# Patient Record
Sex: Female | Born: 1975 | Race: White | Hispanic: No | Marital: Single | Smoking: Current some day smoker
Health system: Southern US, Community
[De-identification: ages and names within clinical notes are randomized; demographics above are authoritative.]

## PROBLEM LIST (undated history)

## (undated) DIAGNOSIS — E039 Hypothyroidism, unspecified: Secondary | ICD-10-CM

---

## 2004-01-31 ENCOUNTER — Ambulatory Visit: Payer: Self-pay | Admitting: Internal Medicine

## 2005-01-11 ENCOUNTER — Ambulatory Visit: Payer: Self-pay

## 2005-03-28 ENCOUNTER — Observation Stay: Payer: Self-pay

## 2005-03-29 ENCOUNTER — Observation Stay: Payer: Self-pay | Admitting: Obstetrics and Gynecology

## 2005-04-14 ENCOUNTER — Inpatient Hospital Stay: Payer: Self-pay

## 2007-11-06 ENCOUNTER — Ambulatory Visit: Payer: Self-pay | Admitting: Obstetrics and Gynecology

## 2007-11-09 ENCOUNTER — Ambulatory Visit: Payer: Self-pay | Admitting: Obstetrics and Gynecology

## 2008-04-08 ENCOUNTER — Ambulatory Visit: Payer: Self-pay | Admitting: Obstetrics and Gynecology

## 2008-04-14 ENCOUNTER — Ambulatory Visit: Payer: Self-pay | Admitting: Obstetrics and Gynecology

## 2017-08-26 ENCOUNTER — Inpatient Hospital Stay (HOSPITAL_COMMUNITY)
Admission: AD | Admit: 2017-08-26 | Discharge: 2017-08-28 | DRG: 885 | Disposition: A | Discharge status: Home / Self Care | Payer: No Typology Code available for payment source | Source: Intra-hospital | Attending: Psychiatry | Admitting: Psychiatry

## 2017-08-26 ENCOUNTER — Encounter (HOSPITAL_COMMUNITY): Payer: Self-pay | Admitting: *Deleted

## 2017-08-26 ENCOUNTER — Other Ambulatory Visit: Payer: Self-pay

## 2017-08-26 DIAGNOSIS — R4587 Impulsiveness: Secondary | ICD-10-CM | POA: Diagnosis not present

## 2017-08-26 DIAGNOSIS — F17210 Nicotine dependence, cigarettes, uncomplicated: Secondary | ICD-10-CM | POA: Diagnosis present

## 2017-08-26 DIAGNOSIS — F322 Major depressive disorder, single episode, severe without psychotic features: Principal | ICD-10-CM | POA: Diagnosis present

## 2017-08-26 DIAGNOSIS — T1491XA Suicide attempt, initial encounter: Secondary | ICD-10-CM | POA: Diagnosis not present

## 2017-08-26 DIAGNOSIS — T43222A Poisoning by selective serotonin reuptake inhibitors, intentional self-harm, initial encounter: Secondary | ICD-10-CM | POA: Diagnosis not present

## 2017-08-26 DIAGNOSIS — E039 Hypothyroidism, unspecified: Secondary | ICD-10-CM | POA: Diagnosis present

## 2017-08-26 DIAGNOSIS — Z91411 Personal history of adult psychological abuse: Secondary | ICD-10-CM | POA: Diagnosis not present

## 2017-08-26 DIAGNOSIS — R45851 Suicidal ideations: Secondary | ICD-10-CM | POA: Diagnosis present

## 2017-08-26 DIAGNOSIS — Z79890 Hormone replacement therapy (postmenopausal): Secondary | ICD-10-CM

## 2017-08-26 DIAGNOSIS — F419 Anxiety disorder, unspecified: Secondary | ICD-10-CM | POA: Diagnosis present

## 2017-08-26 HISTORY — DX: Hypothyroidism, unspecified: E03.9

## 2017-08-26 MED ORDER — HYDROXYZINE HCL 25 MG PO TABS
25.0000 mg | ORAL_TABLET | Freq: Three times a day (TID) | ORAL | Status: DC | PRN
  Filled 2017-08-26: qty 10
  Filled 2017-08-26: qty 1

## 2017-08-26 MED ORDER — MAGNESIUM HYDROXIDE 400 MG/5ML PO SUSP: 30.0000 mL | Freq: Every day | ORAL | Status: DC | PRN

## 2017-08-26 MED ORDER — IBUPROFEN 600 MG PO TABS
600.0000 mg | ORAL_TABLET | Freq: Four times a day (QID) | ORAL | Status: DC | PRN
  Administered 2017-08-27 – 2017-08-28 (×5): 600 mg via ORAL
  Filled 2017-08-26 (×5): qty 1

## 2017-08-26 MED ORDER — ALUM & MAG HYDROXIDE-SIMETH 200-200-20 MG/5ML PO SUSP: 30.0000 mL | ORAL | Status: DC | PRN

## 2017-08-26 MED ORDER — ACETAMINOPHEN 325 MG PO TABS
650.0000 mg | ORAL_TABLET | Freq: Four times a day (QID) | ORAL | Status: DC | PRN
  Administered 2017-08-26: 650 mg via ORAL
  Filled 2017-08-26: qty 2

## 2017-08-26 MED ORDER — TRAZODONE HCL 50 MG PO TABS
50.0000 mg | ORAL_TABLET | Freq: Every evening | ORAL | Status: DC | PRN
  Filled 2017-08-26: qty 1

## 2017-08-26 NOTE — Progress Notes (Signed)
D.  Pt pleasant on approach, denies complaints other than continued chronic back pain (see pain flowsheet).  Pt was positive for evening wrap up group, has been observed appropriately interacting with peers on the unit.  Pt denies SI/HI/AVH at this time.  A.  Support and encouragement offered, medication given as ordered.  R.  Pt remains safe on the unit, will continue to monitor.

## 2017-08-26 NOTE — Tx Team (Signed)
Initial Treatment Plan 08/26/2017 6:18 PM Alexis Mueller XBJ:[id number]    PATIENT STRESSORS: Marital or family conflict   PATIENT STRENGTHS: Ability for insight Average or above average intelligence Capable of independent living Wellsite geologist fund of knowledge Motivation for treatment/growth   PATIENT IDENTIFIED PROBLEMS: Depression Suicidal thoughts "Had a disagreement with fiancee and feeling overwhelmed"                     DISCHARGE CRITERIA:  Ability to meet basic life and health needs Improved stabilization in mood, thinking, and/or behavior Reduction of life-threatening or endangering symptoms to within safe limits Verbal commitment to aftercare and medication compliance  PRELIMINARY DISCHARGE PLAN: Attend aftercare/continuing care group Return to previous living arrangement  PATIENT/FAMILY INVOLVEMENT: This treatment plan has been presented to and reviewed with the patient, Alexis Mueller, and/or family member, .  The patient and family have been given the opportunity to ask questions and make suggestions.  Alexis Mueller, [name], [place] 08/26/2017, 6:18 PM
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
## 2017-08-26 NOTE — [facility] Group Notes (Signed)
[facility] Group Notes:  (Nursing)  Date:  08/26/2017  Time 2:30 PM Type of Therapy:  Nurse Education  Participation Level:  Did Not Attend  Participation Quality:  did not attend  Affect:  did not attend  Cognitive:  did not attend  Insight:  None  Engagement in Group:  None  Modes of Intervention:  Discussion, Education and Rapport Building  Summary of Progress/Problems:  Shela Nevin 08/26/2017, 7:15 PM

## 2017-08-26 NOTE — Progress Notes (Signed)
Alexis Mueller is a 42 year old female pt admitted on voluntary basis. She reports that she got into an argument with her fiancee and spoke about how she took an overdose of some old zoloft that she had laying around from her PCP. She reports she did it because she wanted to see if he really cared. She reports that she used to have a PCP but no longer does and reports that she has not had any medications in the past month or so. She reports that she lives with her fiancee and reports that she will go back there after discharge. She was oriented to the unit and safety maintained.

## 2017-08-27 DIAGNOSIS — F322 Major depressive disorder, single episode, severe without psychotic features: Principal | ICD-10-CM

## 2017-08-27 DIAGNOSIS — F419 Anxiety disorder, unspecified: Secondary | ICD-10-CM

## 2017-08-27 DIAGNOSIS — T1491XA Suicide attempt, initial encounter: Secondary | ICD-10-CM

## 2017-08-27 DIAGNOSIS — F17210 Nicotine dependence, cigarettes, uncomplicated: Secondary | ICD-10-CM

## 2017-08-27 DIAGNOSIS — Z91411 Personal history of adult psychological abuse: Secondary | ICD-10-CM

## 2017-08-27 DIAGNOSIS — R4587 Impulsiveness: Secondary | ICD-10-CM

## 2017-08-27 DIAGNOSIS — T43222A Poisoning by selective serotonin reuptake inhibitors, intentional self-harm, initial encounter: Secondary | ICD-10-CM
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
## 2017-08-27 NOTE — Progress Notes (Signed)
Adult Psychoeducational Group Note  Date:  08/27/2017 Time:  9:48 PM  Group Topic/Focus:  Wrap-Up Group:   The focus of this group is to help patients review their daily goal of treatment and discuss progress on daily workbooks.  Participation Level:  Active  Participation Quality:  Appropriate  Affect:  Appropriate  Cognitive:  Alert and Oriented  Insight: Good  Engagement in Group:  Engaged  Modes of Intervention:  Discussion  Additional Comments:  Pt attended group this evening.  Leo Grosser 08/27/2017, 9:48 PM

## 2017-08-27 NOTE — Progress Notes (Signed)
Pt presents with an animated affect and anxious mood. Pt appears to be minimizing her symptoms. Pt denies depression, anxiety and SI/HI. Pt expressed that she was trying to get her boyfriend's attention and that she was not trying to commit suicide. Pt stated goal for today, "I would like to learn to more effectively communicate when I am frustrated and or overwhelmed". Pt c/o of ongoing intermittent left shoulder pain, radiating down her left arm. Pt stated that she was picking up some firewood about a week ago and believes that she pinched a nerve. Pt stated that she was assessed by the PA at [facility] but he didn't do anything about it. Writer informed Dr. Jola Babinski of pt's complaint. Pain meds and hot/cold packs given for discomfort. Orders reviewed with pt. Verbal support provided. Pt encouraged to attend groups. 15 minute checks performed for safety. Pt compliant with tx plan.

## 2017-08-27 NOTE — Plan of Care (Signed)
D: Pt denies SI/HI/AVH. Pt is pleasant and cooperative. Pt stated she was doing good, pt only complaint was her L-shoulder which was 5/ 10 and she stated heat packs and ibuprofen was helping.   A: Pt was offered support and encouragement. Pt was given scheduled medications. Pt was encourage to attend groups. Q 15 minute checks were done for safety.  R:Pt attends groups and interacts well with peers and staff. Pt is taking medication. Pt has no complaints.Pt receptive to treatment and safety maintained on unit.   Problem: Education: Goal: Emotional status will improve Outcome: Progressing   Problem: Education: Goal: Mental status will improve Outcome: Progressing   Problem: Activity: Goal: Sleeping patterns will improve Outcome: Progressing   Problem: Coping: Goal: Coping ability will improve Outcome: Progressing

## 2017-08-27 NOTE — [facility] Group Notes (Signed)
Adult Psychoeducational Group Note  Date:  08/27/2017 Time:  8:38 AM  Group Topic/Focus:  Goals Group:   The focus of this group is to help patients establish daily goals to achieve during treatment and discuss how the patient can incorporate goal setting into their daily lives to aide in recovery.  Participation Level:  Active  Participation Quality:  Appropriate  Affect:  Appropriate  Cognitive:  Alert  Insight: Appropriate  Engagement in Group:  Engaged  Modes of Intervention:  Orientation  Additional Comments:  Pt attended and participated in orientation/goals group. Pt goal for today is to figure out a aftercare plan once she is discharged from [facility].   Dellia Nims 08/27/2017, 8:38 AM
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
## 2017-08-27 NOTE — Progress Notes (Signed)
Adult Psychoeducational Group Note  Date:  08/27/2017 Time:  1320 Group Topic/Focus:  Coping With Mental Health Crisis:   The purpose of this group is to help patients identify strategies for coping with mental health crisis.  Group discusses possible causes of crisis and ways to manage them effectively.  Participation Level:  Active  Participation Quality:  Appropriate  Affect:  Appropriate  Cognitive:  Appropriate  Insight: Appropriate  Engagement in Group:  Engaged  Modes of Intervention:  Discussion  Additional Comments:    Saleha Kalp L  
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
## 2017-08-28 MED ORDER — HYDROXYZINE HCL 25 MG PO TABS: 25.0000 mg | ORAL_TABLET | Freq: Three times a day (TID) | ORAL | 0 refills | Status: AC | PRN

## 2017-08-28 MED ORDER — LEVOTHYROXINE SODIUM 75 MCG PO TABS: 75.0000 ug | ORAL_TABLET | Freq: Every day | ORAL | 0 refills | Status: AC

## 2017-08-28 NOTE — Plan of Care (Signed)
  Problem: Education: Goal: Knowledge of [facility] Education information/materials will improve 08/28/2017 1201 by Joice Lofts, RN Outcome: Completed/Met 08/28/2017 1120 by Joice Lofts, RN Outcome: Progressing Goal: Emotional status will improve 08/28/2017 1201 by Joice Lofts, RN Outcome: Completed/Met 08/28/2017 1120 by Joice Lofts, RN Outcome: Progressing Goal: Mental status will improve 08/28/2017 1201 by Joice Lofts, RN Outcome: Completed/Met 08/28/2017 1120 by Joice Lofts, RN Outcome: Progressing Goal: Verbalization of understanding the information provided will improve Outcome: Completed/Met   Problem: Activity: Goal: Interest or engagement in activities will improve 08/28/2017 1201 by Joice Lofts, RN Outcome: Completed/Met 08/28/2017 1120 by Joice Lofts, RN Outcome: Progressing Goal: Sleeping patterns will improve 08/28/2017 1201 by Joice Lofts, RN Outcome: Completed/Met 08/28/2017 1120 by Joice Lofts, RN Outcome: Progressing   Problem: Coping: Goal: Ability to verbalize frustrations and anger appropriately will improve Outcome: Completed/Met Goal: Ability to demonstrate self-control will improve Outcome: Completed/Met   Problem: Health Behavior/Discharge Planning: Goal: Identification of resources available to assist in meeting health care needs will improve Outcome: Completed/Met Goal: Compliance with treatment plan for underlying cause of condition will improve Outcome: Completed/Met   Problem: Physical Regulation: Goal: Ability to maintain clinical measurements within normal limits will improve Outcome: Completed/Met   Problem: Safety: Goal: Periods of time without injury will increase Outcome: Completed/Met   Problem: Education: Goal: Ability to make informed decisions regarding treatment will improve Outcome: Completed/Met   Problem: Coping: Goal: Coping ability will  improve Outcome: Completed/Met   Problem: Health Behavior/Discharge Planning: Goal: Identification of resources available to assist in meeting health care needs will improve Outcome: Completed/Met   Problem: Medication: Goal: Compliance with prescribed medication regimen will improve Outcome: Completed/Met   Problem: Self-Concept: Goal: Ability to disclose and discuss suicidal ideas will improve Outcome: Completed/Met Goal: Will verbalize positive feelings about self Outcome: Completed/Met   Problem: Education: Goal: Utilization of techniques to improve thought processes will improve Outcome: Completed/Met Goal: Knowledge of the prescribed therapeutic regimen will improve Outcome: Completed/Met   Problem: Activity: Goal: Interest or engagement in leisure activities will improve Outcome: Completed/Met Goal: Imbalance in normal sleep/wake cycle will improve Outcome: Completed/Met   Problem: Coping: Goal: Coping ability will improve Outcome: Completed/Met Goal: Will verbalize feelings Outcome: Completed/Met   Problem: Health Behavior/Discharge Planning: Goal: Ability to make decisions will improve Outcome: Completed/Met Goal: Compliance with therapeutic regimen will improve Outcome: Completed/Met   Problem: Role Relationship: Goal: Will demonstrate positive changes in social behaviors and relationships Outcome: Completed/Met   Problem: Safety: Goal: Ability to disclose and discuss suicidal ideas will improve Outcome: Completed/Met Goal: Ability to identify and utilize support systems that promote safety will improve Outcome: Completed/Met   Problem: Self-Concept: Goal: Will verbalize positive feelings about self Outcome: Completed/Met Goal: Level of anxiety will decrease Outcome: Completed/Met   Problem: Education: Goal: Knowledge of General Education information will improve Outcome: Completed/Met   Problem: Health Behavior/Discharge Planning: Goal: Ability  to manage health-related needs will improve Outcome: Completed/Met   Problem: Coping: Goal: Level of anxiety will decrease Outcome: Completed/Met   Problem: Safety: Goal: Ability to remain free from injury will improve Outcome: Completed/Met

## 2017-08-28 NOTE — Progress Notes (Signed)
  [facility] Adult Case Management Discharge Plan :  Will you be returning to the same living situation after discharge:  Yes,  with fiancee At discharge, do you have transportation home?: Yes,  fiancee Do you have the ability to pay for your medications: No. No medications prescribed.  Release of information consent forms completed and in the chart;  Patient's signature needed at discharge.  Patient to Follow up at: Follow-up Information    [facility] Go on 08/30/2017.   Why:  Please attend your intake appt on Wednesday, 08/30/17, at 1:45pm.  Please bring photo ID, social security card, proof of insurance, and proof of household income. Contact information: [address] [place] [address] [phone]           Next level of care provider has access to [facility] Link:no  Safety Planning and Suicide Prevention discussed: Yes,  with fiancee  Have you used any form of tobacco in the last 30 days? (Cigarettes, Smokeless Tobacco, Cigars, and/or Pipes): Yes  Has patient been referred to the Quitline?: Patient refused referral  Patient has been referred for addiction treatment: Yes  Lorri Frederick, LCSW 08/28/2017, 11:26 AM

## 2017-08-28 NOTE — Progress Notes (Signed)
Discharge note:  Patient discharged home per MD order.  She received all personal belongings from unit and locker.  Patient denies any depressive symptoms or thoughts of self harm.  Reviewed AVS/transition record with patient and she indicated understanding.  Patient left ambulatory with her fiance.

## 2017-08-28 NOTE — Tx Team (Signed)
Interdisciplinary Treatment and Diagnostic Plan Update  08/28/2017 Time of Session: 9:50am Alexis Mueller MRN: [id number]  Principal Diagnosis: <principal problem not specified>  Secondary Diagnoses: Active Problems:   MDD (major depressive disorder), severe (HCC)   Current Medications:  Current Facility-Administered Medications  Medication Dose Route Frequency Provider Last Rate Last Dose  . alum & mag hydroxide-simeth (MAALOX/MYLANTA) 200-200-20 MG/5ML suspension 30 mL  30 mL Oral Q4H PRN Money, Lowry Ram, FNP      . hydrOXYzine (ATARAX/VISTARIL) tablet 25 mg  25 mg Oral TID PRN Money, Lowry Ram, FNP      . ibuprofen (ADVIL,MOTRIN) tablet 600 mg  600 mg Oral Q6H PRN Lindon Romp A, NP   600 mg at 08/28/17 0827  . magnesium hydroxide (MILK OF MAGNESIA) suspension 30 mL  30 mL Oral Daily PRN Money, Lowry Ram, FNP      . traZODone (DESYREL) tablet 50 mg  50 mg Oral QHS PRN Money, Lowry Ram, FNP       PTA Medications: Medications Prior to Admission  Medication Sig Dispense Refill Last Dose  . levothyroxine (SYNTHROID, LEVOTHROID) 75 MCG tablet Take 75 mcg by mouth daily before breakfast.   Past Week at Unknown time  . sertraline (ZOLOFT) 25 MG tablet Take 25 mg by mouth daily.   Past Month at Unknown time    Patient Stressors: Marital or family conflict  Patient Strengths: Ability for insight Average or above average intelligence Capable of independent living Communication skills General fund of knowledge Motivation for treatment/growth  Treatment Modalities: Medication Management, Group therapy, Case management,  1 to 1 session with clinician, Psychoeducation, Recreational therapy.   Physician Treatment Plan for Primary Diagnosis: <principal problem not specified> Farfan Term Goal(s): Improvement in symptoms so as ready for discharge Improvement in symptoms so as ready for discharge   Short Term Goals: Ability to identify changes in lifestyle to reduce recurrence of condition  will improve Ability to verbalize feelings will improve Ability to disclose and discuss suicidal ideas Ability to demonstrate self-control will improve Ability to identify and develop effective coping behaviors will improve Ability to maintain clinical measurements within normal limits will improve Compliance with prescribed medications will improve Ability to identify changes in lifestyle to reduce recurrence of condition will improve Ability to verbalize feelings will improve Ability to disclose and discuss suicidal ideas Ability to demonstrate self-control will improve Ability to identify and develop effective coping behaviors will improve Ability to maintain clinical measurements within normal limits will improve Compliance with prescribed medications will improve  Medication Management: Evaluate patient's response, side effects, and tolerance of medication regimen.  Therapeutic Interventions: 1 to 1 sessions, Unit Group sessions and Medication administration.  Evaluation of Outcomes: Not Met  Physician Treatment Plan for Secondary Diagnosis: Active Problems:   MDD (major depressive disorder), severe ([facility])  [place]ig Term Goal(s): Improvement in symptoms so as ready for discharge Improvement in symptoms so as ready for discharge   Short Term Goals: Ability to identify changes in lifestyle to reduce recurrence of condition will improve Ability to verbalize feelings will improve Ability to disclose and discuss suicidal ideas Ability to demonstrate self-control will improve Ability to identify and develop effective coping behaviors will improve Ability to maintain clinical measurements within normal limits will improve Compliance with prescribed medications will improve Ability to identify changes in lifestyle to reduce recurrence of condition will improve Ability to verbalize feelings will improve Ability to disclose and discuss suicidal ideas Ability to demonstrate self-control  will improve Ability to  identify and develop effective coping behaviors will improve Ability to maintain clinical measurements within normal limits will improve Compliance with prescribed medications will improve     Medication Management: Evaluate patient's response, side effects, and tolerance of medication regimen.  Therapeutic Interventions: 1 to 1 sessions, Unit Group sessions and Medication administration.  Evaluation of Outcomes: Not Met   RN Treatment Plan for Primary Diagnosis: <principal problem not specified> Steven Term Goal(s): Knowledge of disease and therapeutic regimen to maintain health will improve  Short Term Goals: Ability to remain free from injury will improve, Ability to verbalize frustration and anger appropriately will improve, Ability to demonstrate self-control, Ability to participate in decision making will improve, Ability to verbalize feelings will improve, Ability to disclose and discuss suicidal ideas, Ability to identify and develop effective coping behaviors will improve and Compliance with prescribed medications will improve  Medication Management: RN will administer medications as ordered by provider, will assess and evaluate patient's response and provide education to patient for prescribed medication. RN will report any adverse and/or side effects to prescribing provider.  Therapeutic Interventions: 1 on 1 counseling sessions, Psychoeducation, Medication administration, Evaluate responses to treatment, Monitor vital signs and CBGs as ordered, Perform/monitor CIWA, COWS, AIMS and Fall Risk screenings as ordered, Perform wound care treatments as ordered.  Evaluation of Outcomes: Not Met   LCSW Treatment Plan for Primary Diagnosis: <principal problem not specified> Ard Term Goal(s): Safe transition to appropriate next level of care at discharge, Engage patient in therapeutic group addressing interpersonal concerns.  Short Term Goals: Engage patient in  aftercare planning with referrals and resources, Increase social support, Increase ability to appropriately verbalize feelings, Increase emotional regulation, Facilitate acceptance of mental health diagnosis and concerns, Facilitate patient progression through stages of change regarding substance use diagnoses and concerns, Identify triggers associated with mental health/substance abuse issues and Increase skills for wellness and recovery  Therapeutic Interventions: Assess for all discharge needs, 1 to 1 time with Social worker, Explore available resources and support systems, Assess for adequacy in community support network, Educate family and significant other(s) on suicide prevention, Complete Psychosocial Assessment, Interpersonal group therapy.  Evaluation of Outcomes: Not Met   Progress in Treatment: Attending groups: Yes. Participating in groups: Yes. Taking medication as prescribed: Yes. Toleration medication: Yes. Family/Significant other contact made: No, will contact:  the patient's fiance' Patient understands diagnosis: Yes. Discussing patient identified problems/goals with staff: Yes. Medical problems stabilized or resolved: Yes. Denies suicidal/homicidal ideation: Yes. Issues/concerns per patient self-inventory: No. Other:   New problem(s) identified: None   New Short Term/Beitz Term Goal(s): medication stabilization, elimination of SI thoughts, development of comprehensive mental wellness plan.   Patient Goals: "I want to seek outpatient options once I discharge"  Discharge Plan or Barriers: Patient is returning home with her fiance' and plans to follow up with [facility] for outpatient medication management and therapy services.   Reason for Continuation of Hospitalization: Anxiety Depression Medication stabilization  Estimated Length of Stay: Friday, Sep 01, 2017  Attendees: Patient: Alexis Mueller  08/28/2017 11:41 AM  Physician: Dr. Myles Lipps, MD 08/28/2017  11:41 AM  Nursing: Chrys Racer, RN 08/28/2017 11:41 AM  RN Care Manager: Rhunette Croft 08/28/2017 11:41 AM  Social Worker: Radonna Ricker, Craig 08/28/2017 11:41 AM  Recreational Therapist: X 08/28/2017 11:41 AM  Other: X 08/28/2017 11:41 AM  Other: X 08/28/2017 11:41 AM  Other: Rhunette Croft 08/28/2017 11:41 AM    Scribe for Treatment Team: Marylee Floras, Villalba 08/28/2017 11:41 AM
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
## 2017-08-28 NOTE — Plan of Care (Signed)
  Problem: Education: Goal: Knowledge of [facility] Education information/materials will improve Outcome: Progressing Goal: Emotional status will improve Outcome: Progressing Goal: Mental status will improve Outcome: Progressing   Problem: Activity: Goal: Interest or engagement in activities will improve Outcome: Progressing Goal: Sleeping patterns will improve Outcome: Progressing   

## 2017-08-28 NOTE — [facility] Suicide Risk Assessment (Signed)
[facility] Discharge Suicide Risk Assessment   Principal Problem: <principal problem not specified> Discharge Diagnoses:  Patient Active Problem List   Diagnosis Date Noted  . MDD (major depressive disorder), severe (HCC) [F32.2] 08/26/2017    Total Time spent with patient: 30 minutes  Musculoskeletal: Strength & Muscle Tone: within normal limits Gait & Station: normal Patient leans: N/A  Psychiatric Specialty Exam: Review of Systems  All other systems reviewed and are negative.   Blood pressure 102/70, pulse 73, temperature 98.1 F (36.7 C), temperature source Oral, resp. rate 16, height  (1.626 m), weight 63 kg (139 lb).Body mass index is 23.86 kg/m.  General Appearance: Casual  Eye Contact::  Good  Speech:  Clear and Coherent409  Volume:  Normal  Mood:  Euthymic  Affect:  Appropriate  Thought Process:  Coherent  Orientation:  Full (Time, Place, and Person)  Thought Content:  Logical  Suicidal Thoughts:  No  Homicidal Thoughts:  No  Memory:  Immediate;   Fair  Judgement:  Fair  Insight:  Fair  Psychomotor Activity:  Normal  Concentration:  Good  Recall:  [employer] of Knowledge:Fair  Language: Fair  Akathisia:  No  Handed:  Right  AIMS (if indicated):     Assets:  Communication Skills Desire for Improvement Social Support  Sleep:  Number of Hours: 6.75  Cognition: WNL  ADL's:  Intact   Mental Status Per Nursing Assessment::   On Admission:     Demographic Factors:  Adolescent or young adult and Caucasian  Loss Factors: NA  Historical Factors: Impulsivity  Risk Reduction Factors:   Sense of responsibility to family and Positive social support  Continued Clinical Symptoms:  Previous Psychiatric Diagnoses and Treatments  Cognitive Features That Contribute To Risk:  None    Suicide Risk:  Minimal: No identifiable suicidal ideation.  Patients presenting with no risk factors but with morbid ruminations; may be classified as minimal risk based on  the severity of the depressive symptoms  Follow-up Information    Inc, [facility]. Go on 08/30/2017.   Why:  Please attend your intake appt on Wednesday, 08/30/17, at 1:45pm.  Please bring photo ID, social security card, proof of insurance, and proof of household income. Contact information: [address] [place] [address] [phone]           Plan Of Care/Follow-up recommendations:  Activity:  ad lib  Antonieta Pert, MD 08/28/2017, 11:30 AM

## 2017-08-28 NOTE — Progress Notes (Signed)
Recreation Therapy Notes  Date: 4.29.19 Time: 0930 Location: [address] Dayroom  Group Topic: Stress Management  Goal Area(s) Addresses:  Patient will verbalize importance of using healthy stress management.  Patient will identify positive emotions associated with healthy stress management.   Intervention: Stress Management  Activity :  Body Scan Meditation.  LRT introduced the stress management technique of meditation to patients.  LRT played a meditation that focused on identifying any feeling of tension and stress in the body.  Patients were to follow along as the meditation played.  Education:  Stress Management, Discharge Planning.   Education Outcome: Acknowledges edcuation/In group clarification offered/Needs additional education  Clinical Observations/Feedback: Pt did not attend group.    Mitsugi Schrader, LRT/CTRS         Alexis Mueller A 08/28/2017 12:29 PM 

## 2017-08-28 NOTE — [facility] Suicide Risk Assessment (Signed)
[facility] INPATIENT:  Family/Significant Other Suicide Prevention Education  Suicide Prevention Education:  Education Completed; Jerral Bonito, [phone], has been identified by the patient as the family member/significant other with whom the patient will be residing, and identified as the person(s) who will aid the patient in the event of a mental health crisis (suicidal ideations/suicide attempt).  With written consent from the patient, the family member/significant other has been provided the following suicide prevention education, prior to the and/or following the discharge of the patient.  The suicide prevention education provided includes the following:  Suicide risk factors  Suicide prevention and interventions  National Suicide Hotline telephone number  [facility] assessment telephone number  [facility] Emergency Assistance 911  [facility] and/or Residential Mobile Crisis Unit telephone number  Request made of family/significant other to:  Remove weapons (e.g., guns, rifles, knives), all items previously/currently identified as safety concern.  Only a BB gun in the home, per [place].  Remove drugs/medications (over-the-counter, prescriptions, illicit drugs), all items previously/currently identified as a safety concern.  The family member/significant other verbalizes understanding of the suicide prevention education information provided.  The family member/significant other agrees to remove the items of safety concern listed above.  Brett Canales reports pt does have problems being able to express her emotions.  He is wanting her to enter therapy to work on this and will support it.    Lorri Frederick, LCSW 08/28/2017, 11:22 AM

## 2020-04-22 ENCOUNTER — Other Ambulatory Visit
Admission: RE | Admit: 2020-04-22 | Discharge: 2020-04-22 | Disposition: A | Discharge status: Home / Self Care | Payer: Medicaid Other | Source: Ambulatory Visit | Attending: Physician Assistant | Admitting: Physician Assistant

## 2020-04-22 ENCOUNTER — Other Ambulatory Visit: Payer: Self-pay | Admitting: Physician Assistant

## 2020-04-22 ENCOUNTER — Ambulatory Visit
Admission: RE | Admit: 2020-04-22 | Discharge: 2020-04-22 | Disposition: A | Discharge status: Home / Self Care | Payer: Medicaid Other | Source: Ambulatory Visit | Attending: Physician Assistant | Admitting: Physician Assistant

## 2020-04-22 DIAGNOSIS — J329 Chronic sinusitis, unspecified: Secondary | ICD-10-CM

## 2020-10-12 ENCOUNTER — Emergency Department
Admission: EM | Admit: 2020-10-12 | Discharge: 2020-10-12 | Disposition: A | Discharge status: Home / Self Care | Payer: Medicaid Other | Attending: Emergency Medicine | Admitting: Emergency Medicine

## 2020-10-12 ENCOUNTER — Other Ambulatory Visit: Payer: Self-pay

## 2020-10-12 DIAGNOSIS — F17210 Nicotine dependence, cigarettes, uncomplicated: Secondary | ICD-10-CM | POA: Insufficient documentation

## 2020-10-12 DIAGNOSIS — T7840XA Allergy, unspecified, initial encounter: Secondary | ICD-10-CM | POA: Insufficient documentation

## 2020-10-12 DIAGNOSIS — Z79899 Other long term (current) drug therapy: Secondary | ICD-10-CM | POA: Diagnosis not present

## 2020-10-12 DIAGNOSIS — E039 Hypothyroidism, unspecified: Secondary | ICD-10-CM | POA: Insufficient documentation

## 2020-10-12 DIAGNOSIS — L509 Urticaria, unspecified: Secondary | ICD-10-CM | POA: Diagnosis not present

## 2020-10-12 MED ORDER — PREDNISONE 20 MG PO TABS
60.0000 mg | ORAL_TABLET | Freq: Once | ORAL | Status: AC
  Administered 2020-10-12: 60 mg via ORAL
  Filled 2020-10-12: qty 3

## 2020-10-12 MED ORDER — FAMOTIDINE 40 MG PO TABS
40.0000 mg | ORAL_TABLET | Freq: Every evening | ORAL | 1 refills | Status: AC
Start: 2020-10-12 — End: 2020-10-17

## 2020-10-12 MED ORDER — FAMOTIDINE 20 MG PO TABS
40.0000 mg | ORAL_TABLET | Freq: Once | ORAL | Status: AC
  Administered 2020-10-12: 40 mg via ORAL
  Filled 2020-10-12: qty 2

## 2020-10-12 MED ORDER — DIPHENHYDRAMINE HCL 25 MG PO CAPS
50.0000 mg | ORAL_CAPSULE | Freq: Once | ORAL | Status: AC
  Administered 2020-10-12: 50 mg via ORAL
  Filled 2020-10-12: qty 2

## 2020-10-12 MED ORDER — DIPHENHYDRAMINE HCL 25 MG PO CAPS: 25.0000 mg | ORAL_CAPSULE | Freq: Three times a day (TID) | ORAL | 0 refills | Status: AC | PRN

## 2020-10-12 MED ORDER — PREDNISONE 50 MG PO TABS: ORAL_TABLET | ORAL | 0 refills | Status: AC

## 2020-10-12 NOTE — ED Provider Notes (Signed)
[facility]-EMERGENCY DEPARTMENT  ____________________________________________  Time seen: Approximately 9:08 PM  I have reviewed the triage vital signs and the nursing notes.   HISTORY  Chief Complaint Rash   Historian Patient   HPI Alexis Mueller is a 45 y.o. female presents to the emergency department with diffuse urticaria that started after patient took Zofran.  Patient reports that she has had Zofran 1 other time in her life and tolerated the medication well.  Patient states that she had a sensation of hotness at her neck and at her ears.  She states that she awoke this morning without rash but did have some nausea.  No chest pain, chest tightness, shortness of breath or wheezing.  No syncope.  No prior history of anaphylaxis.  Patient has not attempted any alleviating measures at home.   Past Medical History:  Diagnosis Date   Hypothyroidism      Immunizations up to date:  Yes.     Past Medical History:  Diagnosis Date   Hypothyroidism     Patient Active Problem List   Diagnosis Date Noted   MDD (major depressive disorder), severe (HCC) 08/26/2017    History reviewed. No pertinent surgical history.  Prior to Admission medications   Medication Sig Start Date End Date Taking? Authorizing Provider  diphenhydrAMINE (BENADRYL ALLERGY) 25 mg capsule Take 1 capsule (25 mg total) by mouth every 8 (eight) hours as needed. 10/12/20  Yes Pia Mau M, PA-C  famotidine (PEPCID) 40 MG tablet Take 1 tablet (40 mg total) by mouth every evening for 5 days. 10/12/20 10/17/20 Yes Pia Mau M, PA-C  predniSONE (DELTASONE) 50 MG tablet Take one tablet once daily for five days. 10/12/20  Yes Pia Mau M, PA-C  hydrOXYzine (ATARAX/VISTARIL) 25 MG tablet Take 1 tablet (25 mg total) by mouth 3 (three) times daily as needed for anxiety. 08/28/17   Money, Gerlene Burdock, FNP  levothyroxine (SYNTHROID, LEVOTHROID) 75 MCG tablet Take 1 tablet (75 mcg total) by mouth daily before breakfast.  08/28/17   Money, Gerlene Burdock, FNP    Allergies Patient has no known allergies.  No family history on file.  Social History Social History   Tobacco Use   Smoking status: Some Days    Pack years: 0.00    Types: Cigarettes   Smokeless tobacco: Never  Substance Use Topics   Alcohol use: Not Currently   Drug use: Yes    Types: Marijuana     Review of Systems  Constitutional: No fever/chills Eyes:  No discharge ENT: No upper respiratory complaints. Respiratory: no cough. No SOB/ use of accessory muscles to breath Gastrointestinal:   No nausea, no vomiting.  No diarrhea.  No constipation. Musculoskeletal: Negative for musculoskeletal pain. Skin: Patient has urticaria.     ____________________________________________   PHYSICAL EXAM:  VITAL SIGNS: ED Triage Vitals  Enc Vitals Group     BP 10/12/20 1929 (!) 129/91     Pulse Rate 10/12/20 1929 92     Resp 10/12/20 1929 18     Temp 10/12/20 1929 (!) 97.5 F (36.4 C)     Temp src --      SpO2 10/12/20 1929 97 %     Weight 10/12/20 1927 170 lb (77.1 kg)     Height 10/12/20 1927 5\' 4"  (1.626 m)     Head Circumference --      Peak Flow --      Pain Score 10/12/20 1927 0     Pain Loc --  Pain Edu? --      Excl. in GC? --      Constitutional: Alert and oriented. Well appearing and in no acute distress. Eyes: Conjunctivae are normal. PERRL. EOMI. Head: Atraumatic. ENT:      Nose: No congestion/rhinnorhea.      Mouth/Throat: Mucous membranes are moist.  Neck: No stridor.  No cervical spine tenderness to palpation. Cardiovascular: Normal rate, regular rhythm. Normal S1 and S2.  Good peripheral circulation. Respiratory: Normal respiratory effort without tachypnea or retractions. Lungs CTAB. Good air entry to the bases with no decreased or absent breath sounds Gastrointestinal: Bowel sounds x 4 quadrants. Soft and nontender to palpation. No guarding or rigidity. No distention. Musculoskeletal: Full range of motion to  all extremities. No obvious deformities noted Neurologic:  Normal for age. No gross focal neurologic deficits are appreciated.  Skin: Patient has diffuse urticaria.  Psychiatric: Mood and affect are normal for age. Speech and behavior are normal.   ____________________________________________   LABS (all labs ordered are listed, but only abnormal results are displayed)  Labs Reviewed - No data to display ____________________________________________  EKG   ____________________________________________  RADIOLOGY Geraldo Pitter, personally viewed and evaluated these images (plain radiographs) as part of my medical decision making, as well as reviewing the written report by the radiologist.  No results found.  ____________________________________________    PROCEDURES  Procedure(s) performed:     Procedures     Medications  predniSONE (DELTASONE) tablet 60 mg (60 mg Oral Given 10/12/20 2038)  diphenhydrAMINE (BENADRYL) capsule 50 mg (50 mg Oral Given 10/12/20 2038)  famotidine (PEPCID) tablet 40 mg (40 mg Oral Given 10/12/20 2038)     ____________________________________________   INITIAL IMPRESSION / ASSESSMENT AND PLAN / ED COURSE  Pertinent labs & imaging results that were available during my care of the patient were reviewed by me and considered in my medical decision making (see chart for details).      Assessment and Plan: Urticaria:  45 year old female presents to the emergency department with diffuse urticaria after patient had Zofran.  Vital signs were reassuring at triage.  On physical exam, patient was alert, active and nontoxic-appearing.  Urticaria almost completely resolved with prednisone, Pepcid and Benadryl given in the emergency department.  She was discharged with prednisone, Benadryl and Pepcid.  Return precautions were given to return with new or worsening symptoms.  All patient questions were  answered.     ____________________________________________  FINAL CLINICAL IMPRESSION(S) / ED DIAGNOSES  Final diagnoses:  Allergic reaction, initial encounter      NEW MEDICATIONS STARTED DURING THIS VISIT:  ED Discharge Orders          Ordered    predniSONE (DELTASONE) 50 MG tablet        10/12/20 2202    diphenhydrAMINE (BENADRYL ALLERGY) 25 mg capsule  Every 8 hours PRN        10/12/20 2202    famotidine (PEPCID) 40 MG tablet  Every evening        10/12/20 2202                This chart was dictated using voice recognition software/Dragon. Despite best efforts to proofread, errors can occur which can change the meaning. Any change was purely unintentional.     Gasper Lloyd 10/12/20 2211    Willy Eddy, MD 10/14/20 [phone]

## 2020-10-12 NOTE — ED Triage Notes (Addendum)
Pt states she took zofran earlier and then aprox 3 hours later she broke out in a rash covering her whole body. Pt states she has only taken zofran once before in 2007, with no reaction. Pt denies any shortness of breath or trouble swallowing at this time. Pt took Claritin PTA

## 2020-10-12 NOTE — Discharge Instructions (Addendum)
You can take prednisone once daily. Take 25 mg of Benadryl every 8 hours until symptoms completely resolved. Take 40 mg of Pepcid once daily for the next 5 days.

## 2021-08-18 ENCOUNTER — Other Ambulatory Visit: Payer: Self-pay | Admitting: Physician Assistant

## 2021-08-18 DIAGNOSIS — H9190 Unspecified hearing loss, unspecified ear: Secondary | ICD-10-CM

## 2021-08-27 ENCOUNTER — Ambulatory Visit: Payer: Medicaid Other

## 2021-09-17 IMAGING — CR DG SINUSES COMPLETE 3+V
1 series · 4 of 4 positions shown · non-contrast
Comparison: None.

CLINICAL DATA: Chronic sinusitis

EXAM:
PARANASAL SINUSES - COMPLETE 3 + VIEW

[Series 1: dg sinuses complete · 0.14mm/px · 4 of 4 slices shown]
[im 1/4]
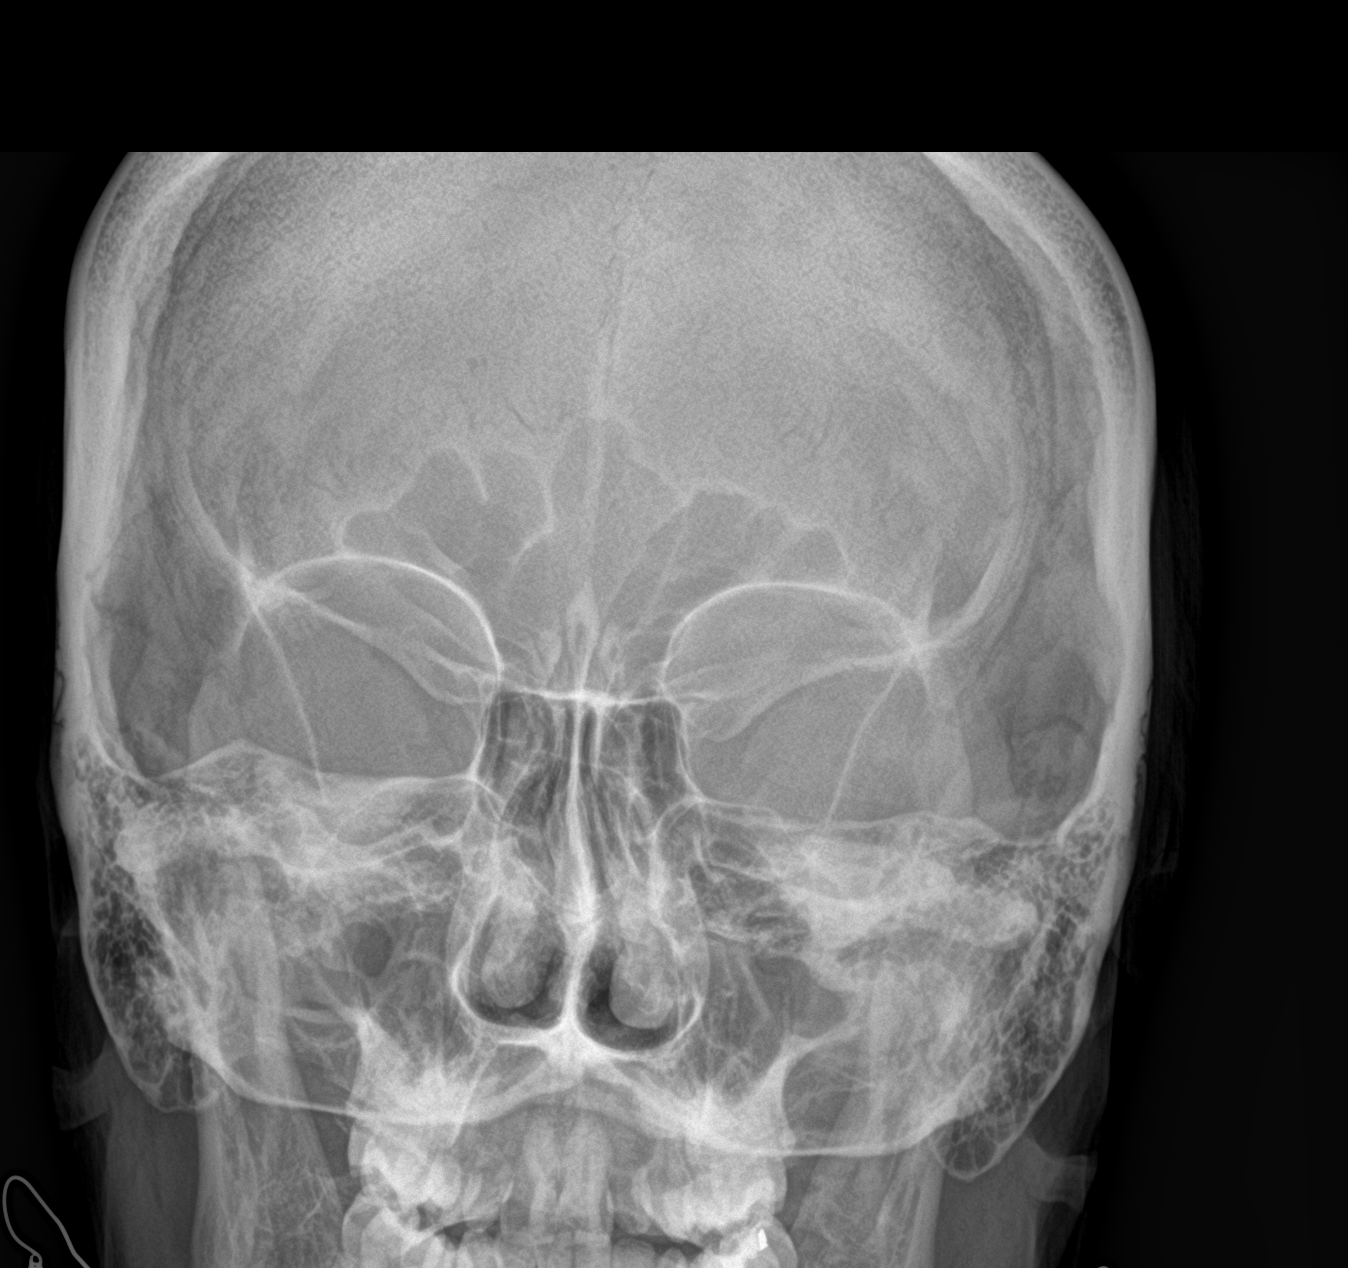
[im 2/4]
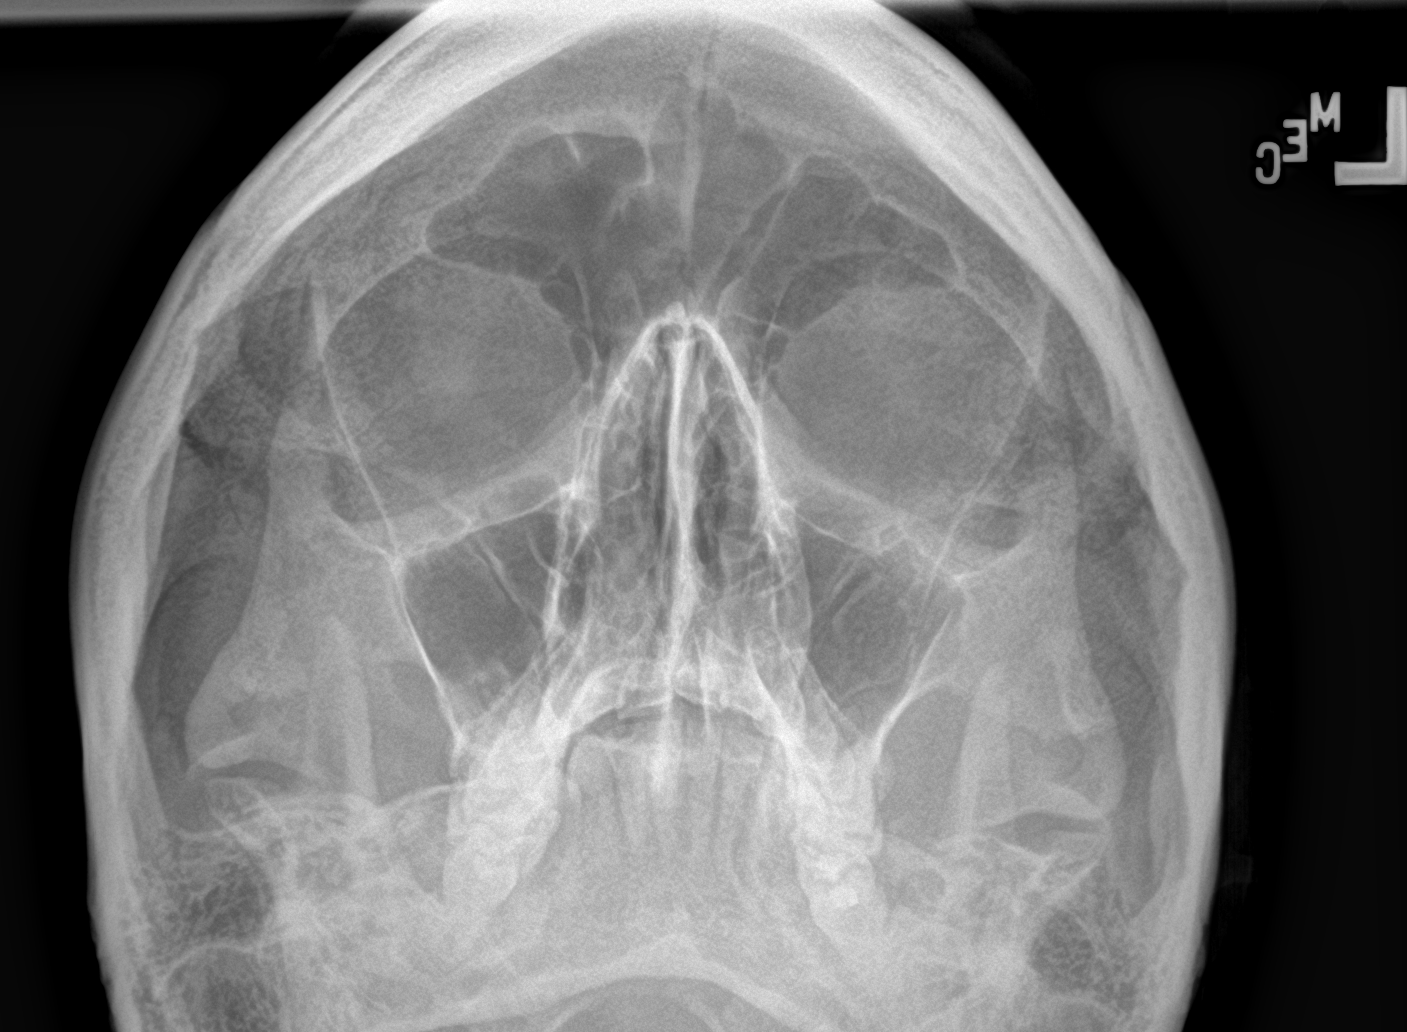
[im 3/4]
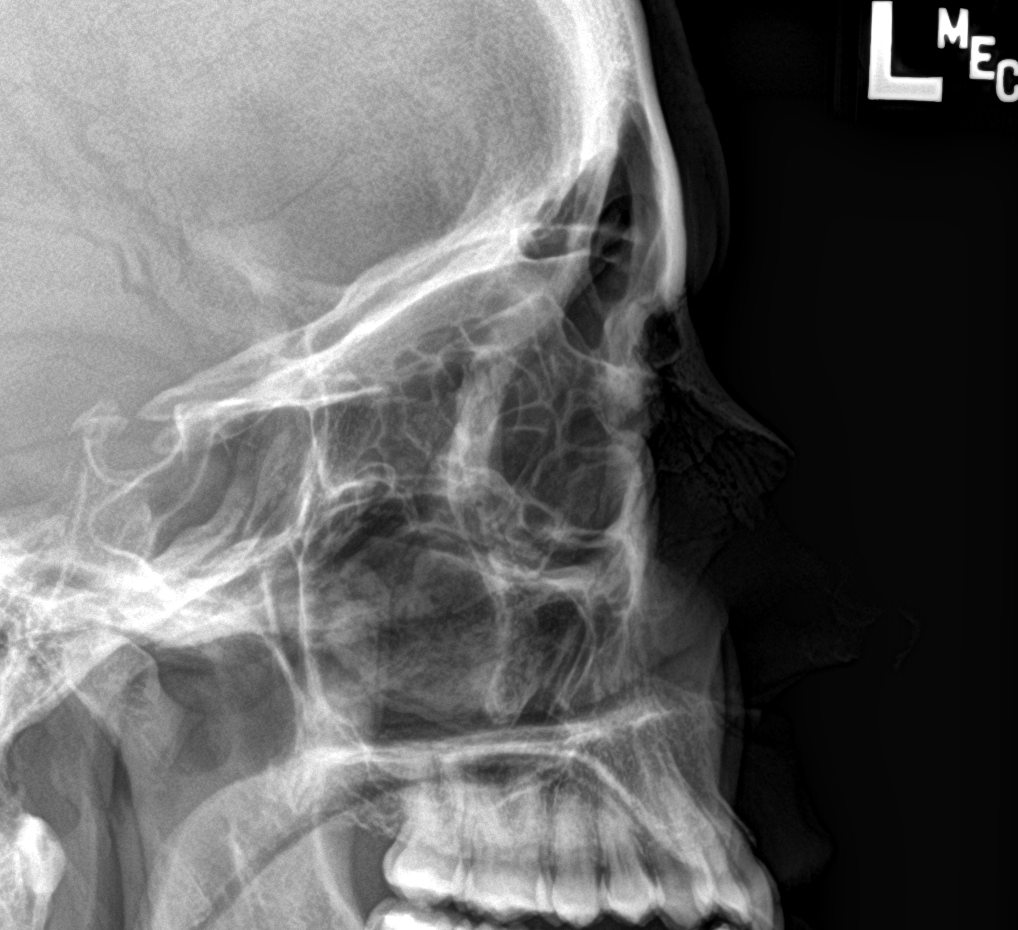
[im 4/4]
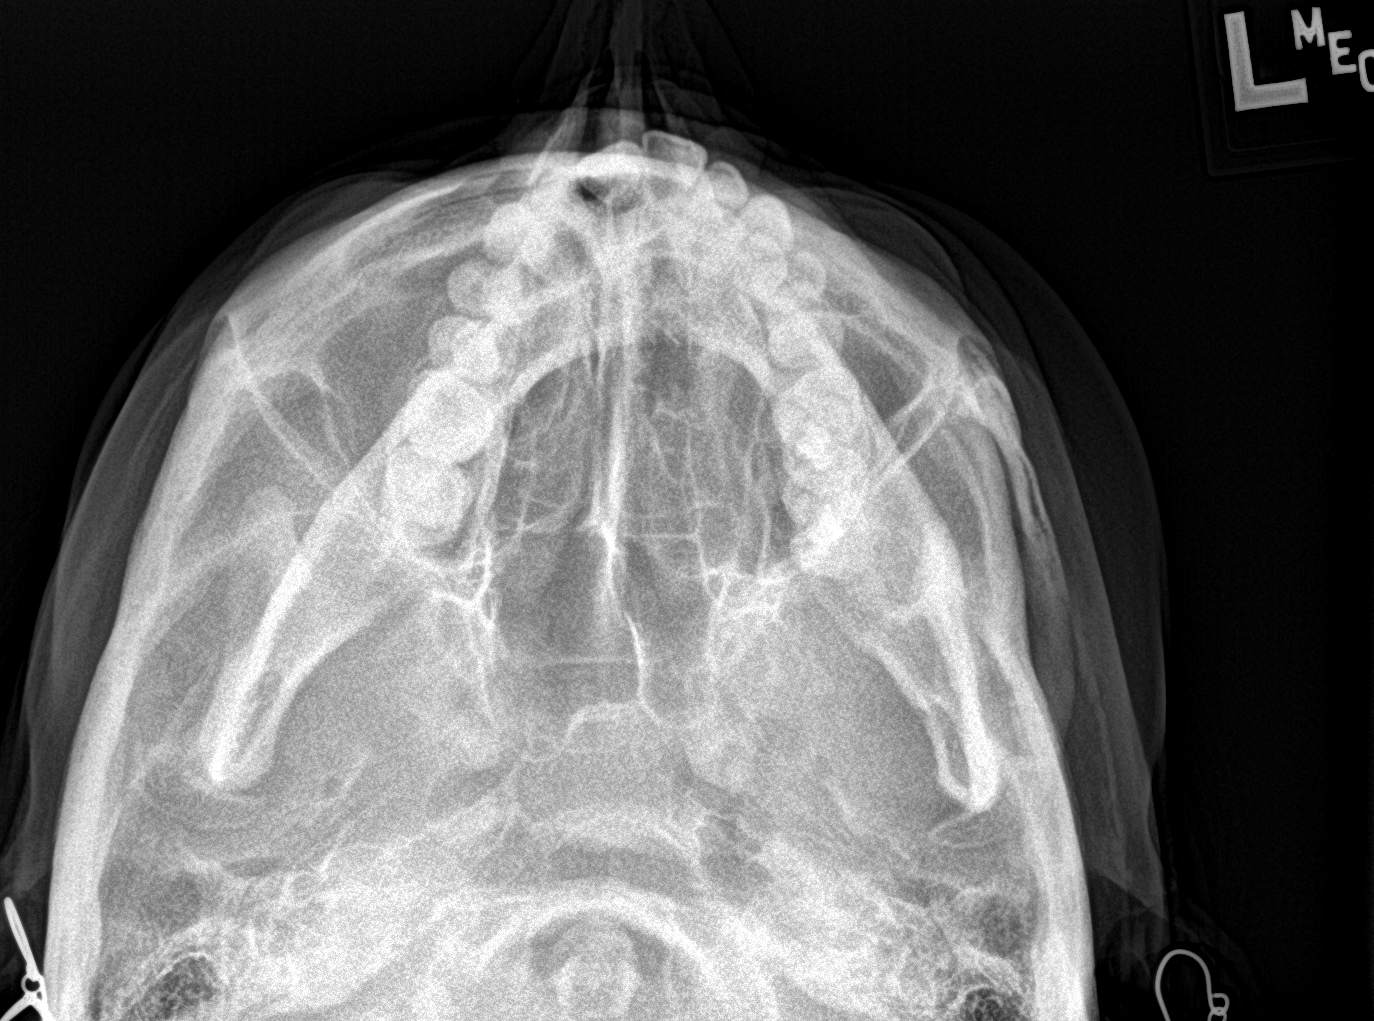

[4 of 4 positions shown; findings below may reference images not displayed]

FINDINGS: The paranasal sinus are aerated. There is no evidence of sinus
opacification air-fluid levels or mucosal thickening. No significant
bone abnormalities are seen.
IMPRESSION: Normal paranasal sinuses.

## 2021-10-06 ENCOUNTER — Ambulatory Visit: Payer: Medicaid Other

## 2022-02-08 ENCOUNTER — Other Ambulatory Visit: Payer: Self-pay | Admitting: Otolaryngology

## 2022-02-08 DIAGNOSIS — G8929 Other chronic pain: Secondary | ICD-10-CM

## 2022-03-01 ENCOUNTER — Ambulatory Visit
Admission: RE | Admit: 2022-03-01 | Discharge: 2022-03-01 | Disposition: A | Discharge status: Home / Self Care | Payer: Medicaid Other | Source: Ambulatory Visit | Attending: Otolaryngology | Admitting: Otolaryngology

## 2022-03-01 DIAGNOSIS — G8929 Other chronic pain: Secondary | ICD-10-CM

## 2022-03-01 MED ORDER — GADOBENATE DIMEGLUMINE 529 MG/ML IV SOLN
15.0000 mL | Freq: Once | INTRAVENOUS | Status: AC | PRN
  Administered 2022-03-01: 15 mL via INTRAVENOUS

## 2022-12-01 ENCOUNTER — Other Ambulatory Visit: Payer: Self-pay | Admitting: Student

## 2022-12-01 DIAGNOSIS — G43119 Migraine with aura, intractable, without status migrainosus: Secondary | ICD-10-CM

## 2022-12-23 ENCOUNTER — Encounter: Payer: Self-pay | Admitting: Student

## 2022-12-27 ENCOUNTER — Ambulatory Visit
Admission: RE | Admit: 2022-12-27 | Discharge: 2022-12-27 | Disposition: A | Discharge status: Home / Self Care | Payer: Medicaid Other | Source: Ambulatory Visit | Attending: Student | Admitting: Student

## 2022-12-27 DIAGNOSIS — G43119 Migraine with aura, intractable, without status migrainosus: Secondary | ICD-10-CM

## 2022-12-27 MED ORDER — GADOPICLENOL 0.5 MMOL/ML IV SOLN
10.0000 mL | Freq: Once | INTRAVENOUS | Status: AC | PRN
  Administered 2022-12-27: 8 mL via INTRAVENOUS

## 2024-05-17 ENCOUNTER — Other Ambulatory Visit: Payer: Self-pay | Admitting: Neurology

## 2024-05-17 DIAGNOSIS — H539 Unspecified visual disturbance: Secondary | ICD-10-CM

## 2024-05-17 DIAGNOSIS — G43119 Migraine with aura, intractable, without status migrainosus: Secondary | ICD-10-CM

## 2024-05-28 ENCOUNTER — Ambulatory Visit
Admission: RE | Admit: 2024-05-28 | Discharge: 2024-05-28 | Disposition: A | Source: Ambulatory Visit | Attending: Neurology | Admitting: Neurology

## 2024-05-28 DIAGNOSIS — G43119 Migraine with aura, intractable, without status migrainosus: Secondary | ICD-10-CM

## 2024-05-28 DIAGNOSIS — H539 Unspecified visual disturbance: Secondary | ICD-10-CM
